# Patient Record
Sex: Male | Born: 1935 | Race: White | Hispanic: No | Marital: Married
Health system: Midwestern US, Academic
[De-identification: ages and names within clinical notes are randomized; demographics above are authoritative.]

---

## 2018-01-22 ENCOUNTER — Encounter: Admit: 2018-01-22 | Discharge: 2018-01-23 | Payer: MEDICARE

## 2018-01-29 ENCOUNTER — Encounter: Admit: 2018-01-29 | Discharge: 2018-01-30 | Payer: MEDICARE

## 2018-02-09 ENCOUNTER — Encounter: Admit: 2018-02-09 | Discharge: 2018-02-10 | Payer: MEDICARE

## 2018-02-13 ENCOUNTER — Encounter: Admit: 2018-02-13 | Discharge: 2018-02-14 | Payer: MEDICARE

## 2018-02-14 ENCOUNTER — Encounter: Admit: 2018-02-14 | Discharge: 2018-02-15 | Payer: MEDICARE

## 2018-02-20 ENCOUNTER — Encounter: Admit: 2018-02-20 | Discharge: 2018-02-21 | Payer: MEDICARE

## 2018-02-27 ENCOUNTER — Encounter: Admit: 2018-02-27 | Discharge: 2018-02-27 | Payer: MEDICARE

## 2018-03-02 ENCOUNTER — Encounter: Admit: 2018-03-02 | Discharge: 2018-03-02 | Payer: MEDICARE

## 2018-03-09 ENCOUNTER — Encounter: Admit: 2018-03-09 | Discharge: 2018-03-09 | Payer: MEDICARE

## 2018-03-09 DIAGNOSIS — C3491 Malignant neoplasm of unspecified part of right bronchus or lung: ICD-10-CM

## 2018-03-09 DIAGNOSIS — C3492 Malignant neoplasm of unspecified part of left bronchus or lung: ICD-10-CM

## 2018-03-09 DIAGNOSIS — C679 Malignant neoplasm of bladder, unspecified: Principal | ICD-10-CM

## 2018-03-09 DIAGNOSIS — H9190 Unspecified hearing loss, unspecified ear: ICD-10-CM

## 2018-03-09 DIAGNOSIS — C3412 Malignant neoplasm of upper lobe, left bronchus or lung: Principal | ICD-10-CM

## 2018-03-09 DIAGNOSIS — C3411 Malignant neoplasm of upper lobe, right bronchus or lung: ICD-10-CM

## 2018-03-09 DIAGNOSIS — C3490 Malignant neoplasm of unspecified part of unspecified bronchus or lung: Secondary | ICD-10-CM

## 2018-03-09 DIAGNOSIS — J449 Chronic obstructive pulmonary disease, unspecified: ICD-10-CM

## 2018-03-09 DIAGNOSIS — Z8551 Personal history of malignant neoplasm of bladder: ICD-10-CM

## 2018-06-08 ENCOUNTER — Encounter: Admit: 2018-06-08 | Discharge: 2018-06-09 | Payer: MEDICARE

## 2018-07-14 ENCOUNTER — Encounter: Admit: 2018-07-14 | Discharge: 2018-07-14 | Payer: MEDICARE

## 2018-07-14 DIAGNOSIS — H9190 Unspecified hearing loss, unspecified ear: ICD-10-CM

## 2018-07-14 DIAGNOSIS — N319 Neuromuscular dysfunction of bladder, unspecified: ICD-10-CM

## 2018-07-14 DIAGNOSIS — F17210 Nicotine dependence, cigarettes, uncomplicated: ICD-10-CM

## 2018-07-14 DIAGNOSIS — Z8551 Personal history of malignant neoplasm of bladder: ICD-10-CM

## 2018-07-14 DIAGNOSIS — C3411 Malignant neoplasm of upper lobe, right bronchus or lung: Principal | ICD-10-CM

## 2018-07-14 DIAGNOSIS — J449 Chronic obstructive pulmonary disease, unspecified: ICD-10-CM

## 2018-07-14 DIAGNOSIS — C3490 Malignant neoplasm of unspecified part of unspecified bronchus or lung: ICD-10-CM

## 2018-07-14 DIAGNOSIS — C679 Malignant neoplasm of bladder, unspecified: Principal | ICD-10-CM

## 2018-08-27 ENCOUNTER — Encounter: Admit: 2018-08-27 | Discharge: 2018-08-28 | Payer: MEDICARE

## 2018-09-23 ENCOUNTER — Encounter: Admit: 2018-09-23 | Discharge: 2018-09-23 | Payer: MEDICARE

## 2018-10-05 ENCOUNTER — Encounter: Admit: 2018-10-05 | Discharge: 2018-10-06 | Payer: MEDICARE

## 2018-10-13 ENCOUNTER — Encounter: Admit: 2018-10-13 | Discharge: 2018-10-13 | Payer: MEDICARE

## 2018-10-13 DIAGNOSIS — C679 Malignant neoplasm of bladder, unspecified: Principal | ICD-10-CM

## 2018-10-13 DIAGNOSIS — F17210 Nicotine dependence, cigarettes, uncomplicated: ICD-10-CM

## 2018-10-13 DIAGNOSIS — H9190 Unspecified hearing loss, unspecified ear: ICD-10-CM

## 2018-10-13 DIAGNOSIS — J449 Chronic obstructive pulmonary disease, unspecified: ICD-10-CM

## 2018-10-13 DIAGNOSIS — C3490 Malignant neoplasm of unspecified part of unspecified bronchus or lung: ICD-10-CM

## 2018-10-13 DIAGNOSIS — C3412 Malignant neoplasm of upper lobe, left bronchus or lung: Principal | ICD-10-CM

## 2018-10-13 DIAGNOSIS — Z8551 Personal history of malignant neoplasm of bladder: ICD-10-CM

## 2018-10-13 DIAGNOSIS — N319 Neuromuscular dysfunction of bladder, unspecified: ICD-10-CM

## 2018-10-23 ENCOUNTER — Encounter: Admit: 2018-10-23 | Discharge: 2018-10-24 | Payer: MEDICARE

## 2018-10-29 ENCOUNTER — Encounter: Admit: 2018-10-29 | Discharge: 2018-10-30 | Payer: MEDICARE

## 2018-12-02 ENCOUNTER — Encounter: Admit: 2018-12-02 | Discharge: 2018-12-02 | Payer: MEDICARE

## 2019-02-22 ENCOUNTER — Encounter: Admit: 2019-02-22 | Discharge: 2019-02-23 | Payer: MEDICARE

## 2019-02-22 ENCOUNTER — Encounter: Admit: 2019-02-22 | Discharge: 2019-02-22 | Payer: MEDICARE

## 2019-04-06 ENCOUNTER — Encounter: Admit: 2019-04-06 | Discharge: 2019-04-06 | Payer: MEDICARE

## 2019-04-13 ENCOUNTER — Encounter: Admit: 2019-04-13 | Discharge: 2019-04-13 | Payer: MEDICARE

## 2019-04-13 DIAGNOSIS — C3412 Malignant neoplasm of upper lobe, left bronchus or lung: Principal | ICD-10-CM

## 2019-04-13 DIAGNOSIS — C3490 Malignant neoplasm of unspecified part of unspecified bronchus or lung: Secondary | ICD-10-CM

## 2019-04-13 DIAGNOSIS — Z8551 Personal history of malignant neoplasm of bladder: ICD-10-CM

## 2019-04-13 DIAGNOSIS — Z87891 Personal history of nicotine dependence: ICD-10-CM

## 2019-04-13 DIAGNOSIS — J449 Chronic obstructive pulmonary disease, unspecified: ICD-10-CM

## 2019-04-13 DIAGNOSIS — N319 Neuromuscular dysfunction of bladder, unspecified: ICD-10-CM

## 2019-04-13 DIAGNOSIS — H9190 Unspecified hearing loss, unspecified ear: ICD-10-CM

## 2019-04-13 DIAGNOSIS — C679 Malignant neoplasm of bladder, unspecified: Principal | ICD-10-CM

## 2019-04-15 NOTE — Progress Notes
Name: Jared Webster          MRN: [id number]      DOB: 17-Jun-1934      AGE: 85 y.o.   DATE OF SERVICE: 04/13/2019    Subjective:             Reason for Visit:  Follow Up      Jared Webster is a 85 y.o. male.     Cancer Staging  No matching staging information was found for the patient.    History of Present Illness  36. 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   2. Patient recently underwent CT chest 01/22/2018 d/t c/o SOA. This showed RUL lung mass and LUL spiculated nodule.  3. 01/29/18- PET, RUL mass with SUV 3.1. LUL nodule with SUV 1.3. Right hilar LN with SUV 4.4.  4. 02/09/18- CT guided core biopsy of RUL mass. Path revealed small cell carcinoma.   5. 02/13/18- CT guided core biopsy of LUL nodule. Path revealed atypical glandular proliferation, suspicious for malignancy. Path sent to [facility], gave diagnosis of well differentiated adenocarcinoma. ???  6. 02/18/18- initial med onc consult with Dr. Donnajean Lopes. Recomended carboplatin AUC 6 and etoposide 80mg /m2. Patient was also referred to radiation oncology with plan to start concurrent chemoradiation with second cycle. We do not have formal notes from radiation oncology but per patient's description, plan to proceed with SBRT to left lung NSCLC afterwards  NO PCI after concurrent chemo radiation.   Interval History:  Jared Webster returns for a follow-up visit.  He reports having had a recent CT scan in April 2020. He notes improved  fatigue and limited exercise tolerance but otherwise has no other residual symptoms from chemoradiotherapy.  He is scheduled to have another CT and Brain MRI in July. He continues to smoke.        Review of Systems   Constitutional: Positive for activity change, appetite change and fatigue. Negative for fever and unexpected weight change. HENT: Negative for ear discharge, hearing loss, postnasal drip, sneezing, sore throat and tinnitus.    Eyes: Negative for discharge and itching.   Respiratory: Positive for cough and shortness of breath. Negative for apnea and chest tightness.    Cardiovascular: Negative for chest pain, palpitations and leg swelling.   Gastrointestinal: Negative for abdominal distention, abdominal pain, constipation, diarrhea and nausea.   Genitourinary: Negative for difficulty urinating.   Musculoskeletal: Negative for arthralgias.   Neurological: Negative for dizziness, tremors, seizures and light-headedness.         Objective:         ??? albuterol (PROAIR HFA, VENTOLIN HFA, OR PROVENTIL HFA) 90 mcg/actuation inhaler Every 4-6 Hours As Needed as needed for Shortness Of Breath/Wheezing   ??? budesonide/formoterol (SYMBICORT HFA) 160/4.5 mcg inhalation Twice A Day   ??? diltiazem (TIAZAC) 120 mg capsule Take 120 mg by mouth daily.   ??? diphenhydramine HCl (BENADRYL ALLERGY PO) Take  by mouth at bedtime as needed.   ??? finasteride (PROSCAR) 5 mg tablet Take 5 mg by mouth daily.   ??? melatonin 10 mg cap 1 Tab, AT BEDTIME, PO, 0 Number of Refills   ??? tamsulosin (FLOMAX) 0.4 mg capsule Take 0.4 mg by mouth daily.     Vitals:    04/13/19 1006   BP: 134/69   BP Source: Arm, Left Upper  Patient Position: Sitting   Pulse: 79   Resp: 16   Temp: 36.3 ???C (97.3 ???F)   TempSrc: Temporal   SpO2: 97%   Weight: 79 kg (174 lb 3.2 oz)   Height: 172 cm (67.72)   PainSc: Zero     Body mass index is 26.71 kg/m???.     Pain Score: Zero       Fatigue Scale: 0-None    Pain Addressed:  N/A    Patient Evaluated for a Clinical Trial: No treatment clinical trial available for this patient.     [place] Cooperative Oncology Group performance status is 1, Restricted in physically strenuous activity but ambulatory and able to carry out work of a light or sedentary nature, e.g., light house work, office work.     Physical Exam  Constitutional: Appearance: He is well-developed.   HENT:      Head: Normocephalic.   Eyes:      Pupils: Pupils are equal, round, and reactive to light.   Neck:      Musculoskeletal: Normal range of motion.      Thyroid: No thyromegaly.      Trachea: No tracheal deviation.   Cardiovascular:      Rate and Rhythm: Normal rate and regular rhythm.      Heart sounds: Normal heart sounds.   Pulmonary:      Effort: Pulmonary effort is normal. No respiratory distress.      Breath sounds: Normal breath sounds. No wheezing.   Abdominal:      General: Bowel sounds are normal. There is no distension.      Palpations: Abdomen is soft.      Tenderness: There is no abdominal tenderness. There is no rebound.   Musculoskeletal: Normal range of motion.         General: No tenderness.   Lymphadenopathy:      Cervical: No cervical adenopathy.   Neurological:      Mental Status: He is alert and oriented to person, place, and time.             Assessment and Plan:    Problem   Sclc (Small Cell Lung Carcinoma) (Hcc)    48. 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   2. Patient recently underwent CT chest 01/22/2018 d/t c/o SOA. This showed RUL lung mass and LUL spiculated nodule.  3. 01/29/18- PET, RUL mass with SUV 3.1. LUL nodule with SUV 1.3. Right hilar LN with SUV 4.4.  4. 02/09/18- CT guided core biopsy of RUL mass. Path revealed small cell carcinoma.   5. 02/13/18- CT guided core biopsy of LUL nodule. Path revealed atypical glandular proliferation, suspicious for malignancy. Path sent to [facility], gave diagnosis of well differentiated adenocarcinoma. ???  6. 02/18/18- Initial med onc consult with Dr. Donnajean Lopes. Recomended carboplatin AUC 6 and etoposide 80mg /m2. Patient was also referred to radiation oncology with plan to start concurrent chemoradiation with second cycle. We do not have formal notes from radiation oncology but per patient's description, plan to proceed with SBRT to left lung NSCLC afterwards  7.  Started cycle 1 of carboplatin and etoposide on February 19, 2018.  Cycle 4 of chemotherapy was completed April 24, 2018.  Started concurrent radiation on March 11, 2018  8.  Received SBRT to the left upper lobe non-small cell lung cancer  from June 24, 2018 to July 02, 2018         SCLC (small cell lung carcinoma) [facility])  He returns for follow up for small cell cancer treated in summer of 2019. He also had a separate non small cell carcinoma, Stage I, it was treated with SBRT. He denies any new complains. He has stable exertional dyspnea, No new cough. On examination he has no adenopathy. Clear lungs bilaterally. Optimize He continues to smoke. He is scheduled to see pulmonology to pulmonology to optimize COPD treatment.     Last CT scan of chest from April 2020 reviewed. CT scan showed no new changes. Stable lung field.  He has no headache.   Recommend continued surveillance. He has CT and brain MRI scheduled for July 2020.   He may return in 6 months for follow up.

## 2019-10-08 ENCOUNTER — Encounter: Admit: 2019-10-08 | Discharge: 2019-10-08 | Payer: MEDICARE

## 2019-10-08 NOTE — Telephone Encounter
called patient to reschedule his appt with Dr. Robley Fries on 11/24. Patient requested that we move it. He agreed to the day and time for the new appt.

## 2019-10-19 IMAGING — CT CHEST W(Adult)
2 of 4 series · 14 of 36 positions shown, 17 images · IV contrast (Omnipaque)
Comparison: none

[Series 2: thorax ax 5.00 br40 s3 · axial · 0.65mm/px · z∈[+1688,+1968]mm · 11 of 68 slices shown, 14 images]
[im 6/68  mediastinal]
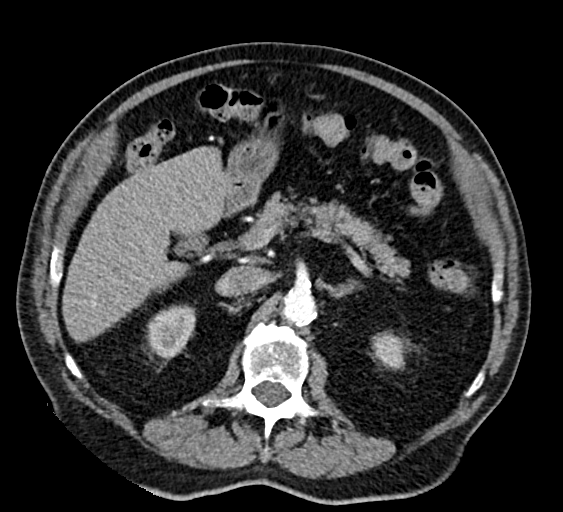
[im 6/68  lung]
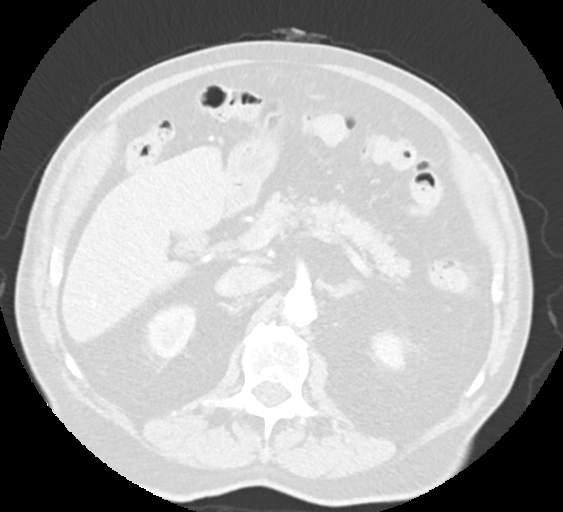
[im 11/68  lung]
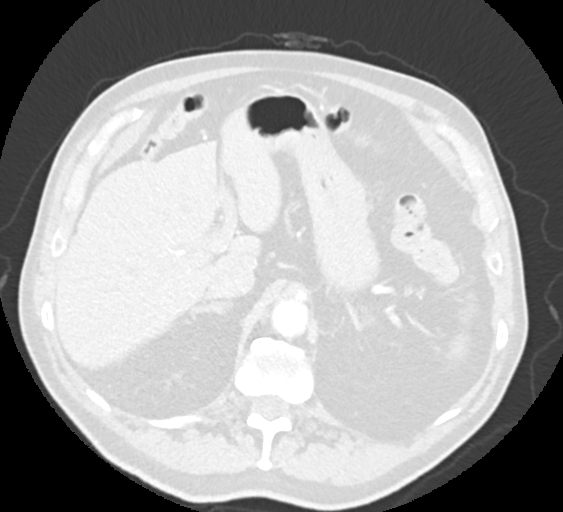
[im 16/68  lung]
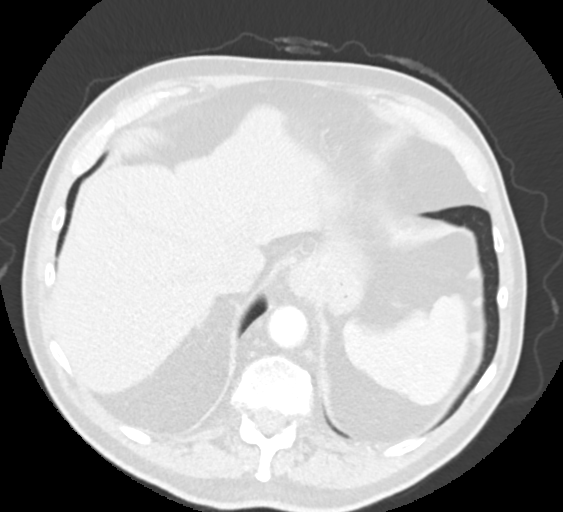
[im 21/68  lung]
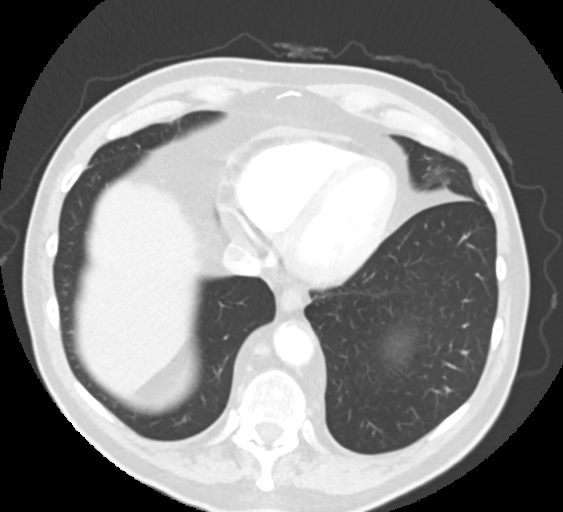
[im 26/68  mediastinal]
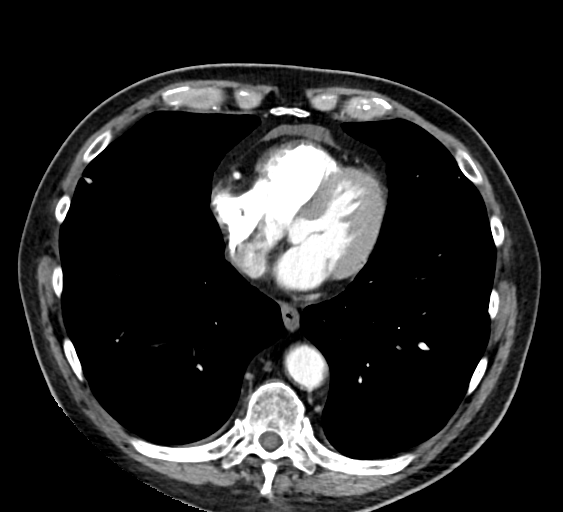
[im 26/68  lung]
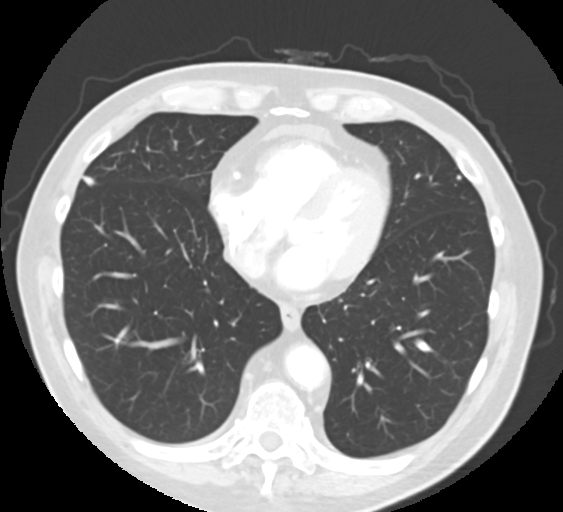
[im 37/68  lung]
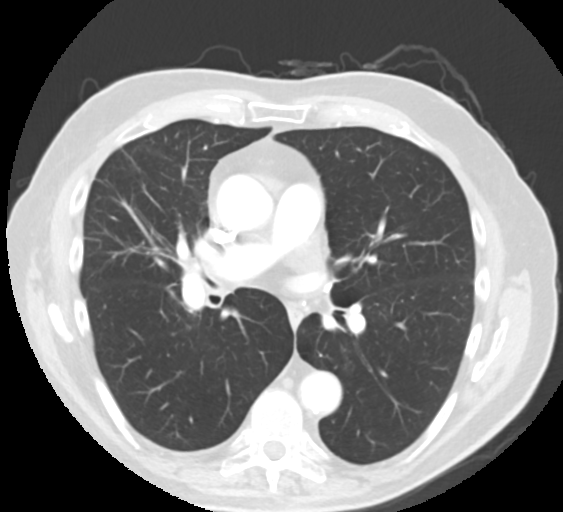
[im 42/68  lung]
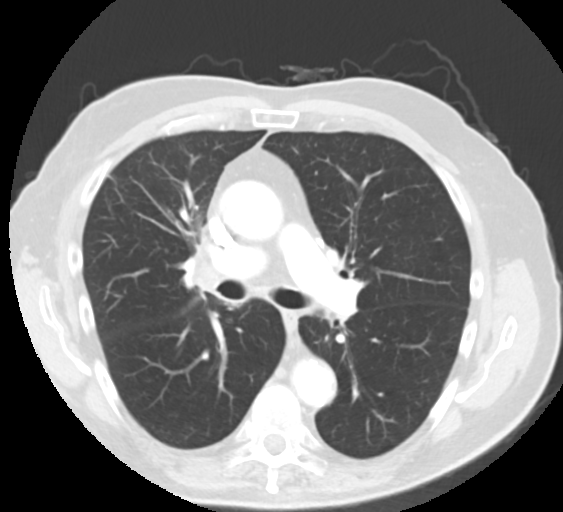
[im 47/68  lung]
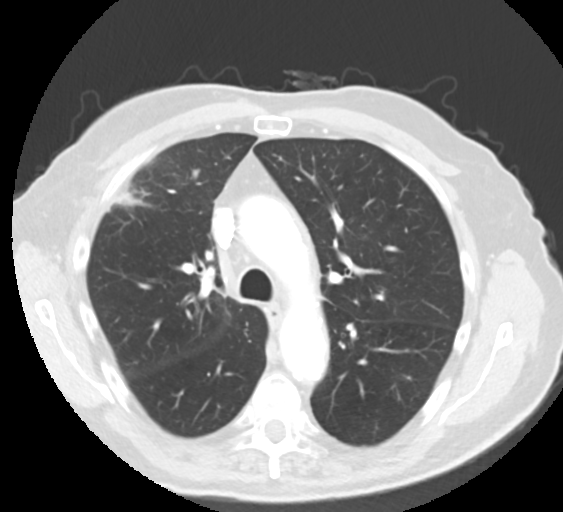
[im 52/68  mediastinal]
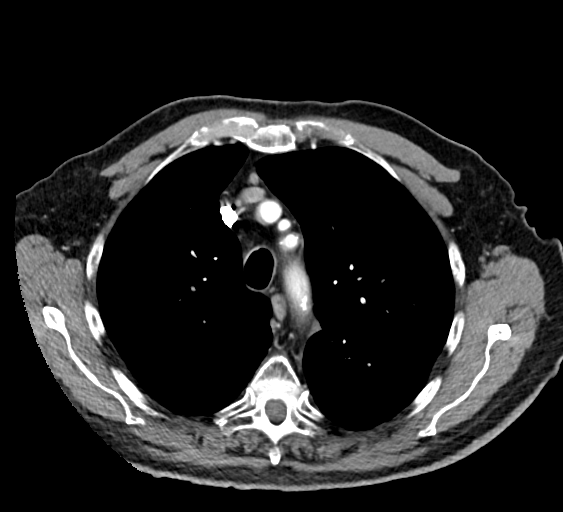
[im 52/68  lung]
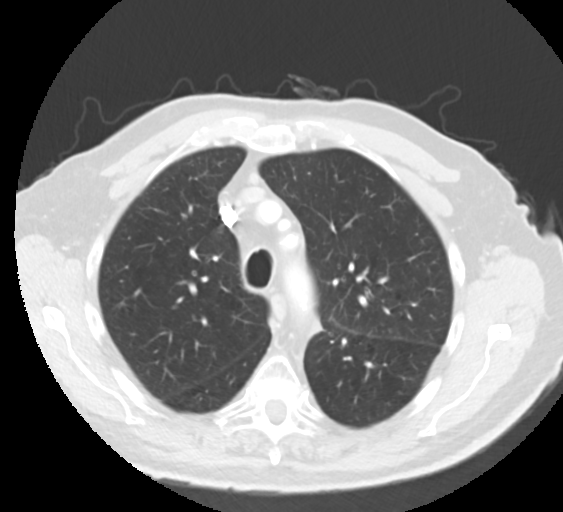
[im 57/68  lung]
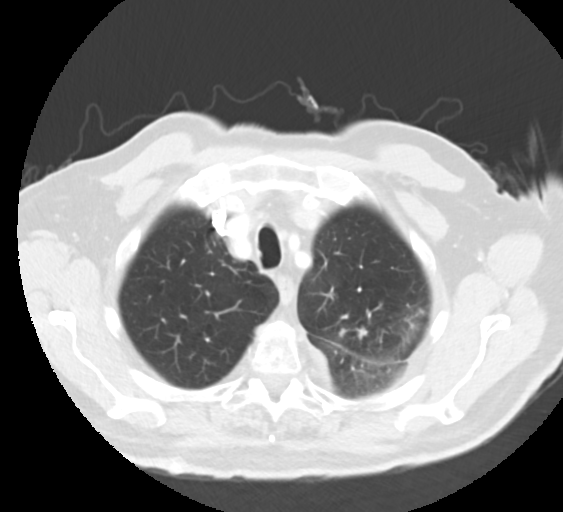
[im 62/68  lung]
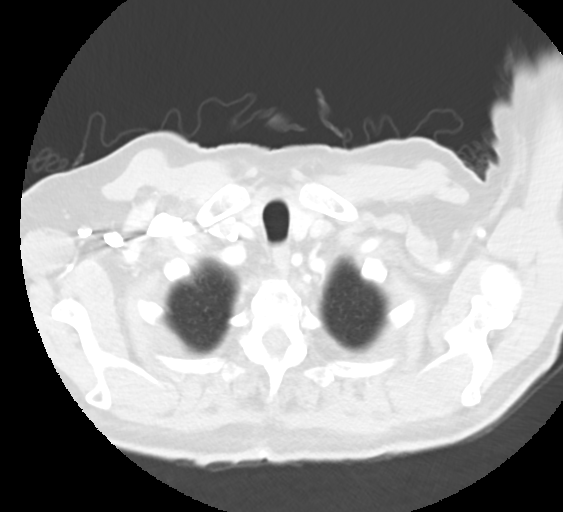

[Series 4: thorax cor 5.00 br40 s3 · coronal · 0.67mm/px · 3 of 66 slices shown]
[im 14/66  lung]
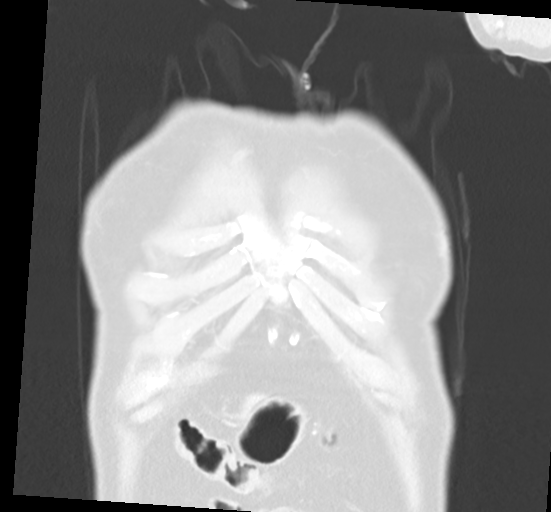
[im 27/66  lung]
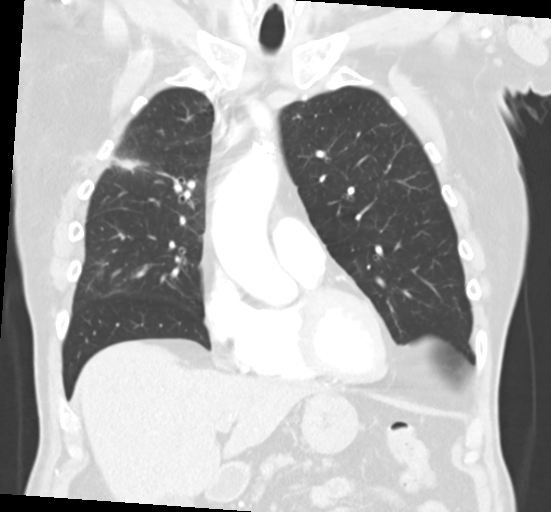
[im 40/66  lung]
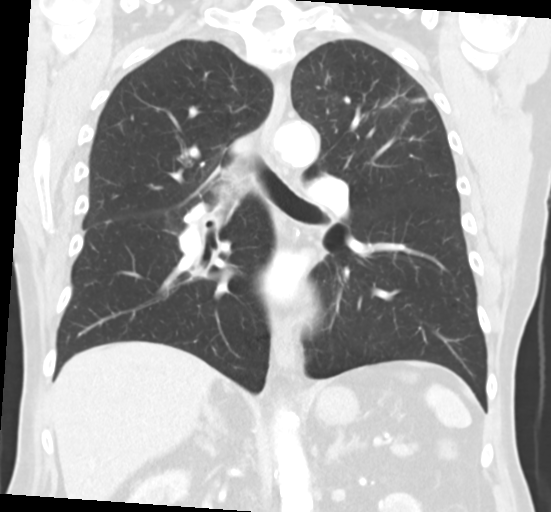

[14 of 36 positions shown; findings below may reference images not displayed]

DIAGNOSTIC STUDIES

EXAM

CT chest with contrast

INDICATION

Follow up on small cell lung cancer
F/U small cell lung cancer. Previous CT 08/27/18. GFR: 62.9. CREATININE: 0.92. VX28PCC 266U1 used.
CC/AK

TECHNIQUE

Volumetric multi detector CT images of the chest are obtained after the administration of  100 cc
low osmolar intravenous contrast

All CT scans at this facility use dose modulation, iterative reconstruction, and/or weight based
dosing when appropriate to reduce radiation dose to as low as reasonably achievable.

COMPARISONS

CT chest without contrast August 27, 2018

FINDINGS

The thoracic inlet is grossly unremarkable.

The thoracic aorta demonstrates minimal atherosclerotic calcification and is non aneurysmal.

There is no evidence of filling defect within the pulmonary arteries to suggest pulmonary embolus.

There is no mediastinal, hilar or axillary adenopathy.

There is mild central bronchial thickening with traction bronchiectasis predominantly within the
upper lobes.

There is no dense consolidation, effusion, or pneumothorax. Post treatment changes of the posterior
superior aspect of the left upper lobe are noted with linear parenchymal scar and pleural
thickening.

Previously seen pulmonary mass lesion within the peripheral right upper lobe has decreased in size
with residual focal pleural thickening measuring up to 1 point 6 centimeters in greatest dimension,
previously measuring 2.1 centimeters best appreciated on series 2, image 22.

The partially visualized upper abdominal viscera are grossly stable.

The thoracic vertebral body heights are grossly maintained with mild multilevel degenerative disc
disease. There is no evidence of lytic or blastic lesion.

IMPRESSION

1.  Evolving post treatment changes of the posterior superior left upper lobe as well as the
peripheral inferior right upper lobe with decreased size of previously seen right upper lobe pleural
mass.

Tech Notes:

F/U small cell lung cancer. Previous CT 08/27/18. GFR: 62.9. CREATININE: 0.92. VX28PCC 266U1 used.
CC/AK

## 2022-05-18 DEATH — deceased
